# Patient Record
Sex: Female | Born: 1990 | Race: Black or African American | Hispanic: No | Marital: Single | Smoking: Never smoker
Health system: Southern US, Community
[De-identification: ages and names within clinical notes are randomized; demographics above are authoritative.]

## PROBLEM LIST (undated history)

## (undated) DIAGNOSIS — Z789 Other specified health status: Secondary | ICD-10-CM

## (undated) HISTORY — PX: HERNIA REPAIR: SHX51

---
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
## 2016-08-12 ENCOUNTER — Emergency Department (HOSPITAL_COMMUNITY)
Admission: EM | Admit: 2016-08-12 | Discharge: 2016-08-12 | Disposition: A | Discharge status: Home / Self Care | Payer: Medicaid Other | Attending: Emergency Medicine | Admitting: Emergency Medicine

## 2016-08-12 ENCOUNTER — Emergency Department (HOSPITAL_COMMUNITY): Payer: Medicaid Other

## 2016-08-12 ENCOUNTER — Encounter (HOSPITAL_COMMUNITY): Payer: Self-pay | Admitting: Emergency Medicine

## 2016-08-12 DIAGNOSIS — B9789 Other viral agents as the cause of diseases classified elsewhere: Secondary | ICD-10-CM

## 2016-08-12 DIAGNOSIS — R05 Cough: Secondary | ICD-10-CM | POA: Diagnosis present

## 2016-08-12 DIAGNOSIS — J069 Acute upper respiratory infection, unspecified: Secondary | ICD-10-CM

## 2016-08-12 MED ORDER — BENZONATATE 100 MG PO CAPS: 100.0000 mg | ORAL_CAPSULE | Freq: Once | ORAL | Status: DC

## 2016-08-12 NOTE — ED Provider Notes (Signed)
[facility]-EMERGENCY DEPT Provider Note   CSN: [id number] Arrival date & time: 08/12/16  1405   By signing my name below, I, Nelwyn SalisburyJoshua Fowler, attest that this documentation has been prepared under the direction and in the presence of non-physician practitioner, Rise MuKenneth T. Buford Gayler, PA-C.Marland Kitchen. Electronically Signed: Nelwyn SalisburyJoshua Fowler, Scribe. 08/12/2016. 4:21 PM.   History   Chief Complaint Chief Complaint  Patient presents with  . URI  . Cough   The history is provided by the patient. No language interpreter was used.    HPI Comments:  Alison Cruz is an otherwise healthy 25 y.o. female who presents to the Emergency Department complaining of gradual onset frequent moderate productive cough beginning about 5 days ago. Pt describes her sputum as thick but clear. She reports associated chest soreness secondary to cough, congestion, rhinorrhea, sneezing, fever, chills, diffuse muscle aches. She has taken Zycam at home for her symptoms with minimal relief. Pt denies any sore throat, nausea or SOB. She smokes Black and Mild's very rarely. Endorses sick contacts.  History reviewed. No pertinent past medical history.  There are no active problems to display for this patient.   History reviewed. No pertinent surgical history.  OB History    No data available       Home Medications    Prior to Admission medications   Not on File    Family History No family history on file.  Social History Social History  Substance Use Topics  . Smoking status: Never Smoker  . Smokeless tobacco: Never Used  . Alcohol use Yes     Allergies   Patient has no allergy information on record.   Review of Systems Review of Systems  Constitutional: Positive for chills and fever.  HENT: Positive for congestion, rhinorrhea and sneezing. Negative for sore throat.   Respiratory: Positive for cough, chest tightness and wheezing. Negative for shortness of breath.   Gastrointestinal: Negative for nausea.    Musculoskeletal: Positive for myalgias.     Physical Exam Updated Vital Signs BP 129/78 (BP Location: Right Arm)   Pulse 73   Temp 97.9 F (36.6 C) (Oral)   Resp 17   LMP 06/25/2016 Comment: irregular periods  SpO2 100%   Physical Exam  Constitutional: She is oriented to person, place, and time. She appears well-developed and well-nourished. No distress.  HENT:  Head: Normocephalic and atraumatic.  Right Ear: Tympanic membrane, external ear and ear canal normal.  Left Ear: Tympanic membrane, external ear and ear canal normal.  Nose: Mucosal edema and rhinorrhea present. Right sinus exhibits maxillary sinus tenderness. Right sinus exhibits no frontal sinus tenderness. Left sinus exhibits maxillary sinus tenderness. Left sinus exhibits no frontal sinus tenderness.  Mouth/Throat: Uvula is midline, oropharynx is clear and moist and mucous membranes are normal.  Eyes: Conjunctivae are normal.  Neck: Normal range of motion. Neck supple.  Cardiovascular: Normal rate.   Pulmonary/Chest: Effort normal and breath sounds normal. No respiratory distress.  CTAB  Lymphadenopathy:    She has no cervical adenopathy.  Neurological: She is alert and oriented to person, place, and time.  Skin: Skin is warm and dry. Capillary refill takes less than 2 seconds.  Psychiatric: She has a normal mood and affect.  Nursing note and vitals reviewed.    ED Treatments / Results  DIAGNOSTIC STUDIES:  Oxygen Saturation is 100% on RA, normal by my interpretation.    COORDINATION OF CARE:  4:26 PM Discussed treatment plan with pt at bedside which includes OTC Mucinex DM  and symptomatic management and pt agreed to plan.  Labs (all labs ordered are listed, but only abnormal results are displayed) Labs Reviewed - No data to display  EKG  EKG Interpretation None       Radiology Dg Chest 2 View  Result Date: 08/12/2016 CLINICAL DATA:  Cough with congestion and chest pain EXAM: CHEST  2 VIEW  COMPARISON:  March 17, 2016 FINDINGS: Lungs are clear. Heart size and pulmonary vascularity are normal. No adenopathy. No pneumothorax. No bone lesions. IMPRESSION: No edema or consolidation. Electronically Signed   By: Bretta BangWilliam  Woodruff III M.D.   On: 08/12/2016 17:22    Procedures Procedures (including critical care time)  Medications Ordered in ED Medications - No data to display   Initial Impression / Assessment and Plan / ED Course  I have reviewed the triage vital signs and the nursing notes.  Pertinent labs & imaging results that were available during my care of the patient were reviewed by me and considered in my medical decision making (see chart for details).  Clinical Course   Pt CXR negative for acute infiltrate. Patients symptoms are consistent with URI, likely viral etiology. Discussed that antibiotics are not indicated for viral infections. Pt will be discharged with symptomatic treatment.  Verbalizes understanding and is agreeable with plan.Patient is afebrile and not tachycardic. Pt is hemodynamically stable & in NAD prior to dc.Discussed return precautions.   Final Clinical Impressions(s) / ED Diagnoses   Final diagnoses:  Viral URI with cough    New Prescriptions New Prescriptions   No medications on file  I personally performed the services described in this documentation, which was scribed in my presence. The recorded information has been reviewed and is accurate.     Rise MuKenneth T Shemeka Wardle, PA-C 08/12/16 1740    Gwyneth SproutWhitney Plunkett, MD 08/12/16 2036

## 2016-08-12 NOTE — ED Notes (Signed)
Pt here with cold cough stuffy nose for 1-2 weeks  She has difficulty breathing through her nose  Temp last  Friday productive cough thick yelow

## 2016-08-12 NOTE — Discharge Instructions (Signed)
Your chest x-ray was normal today. This is likely a viral infection. I recommend getting Mucinex DM over-the-counter with nasal decongestant and cough suppressant. You may also use hot steam from shower to help loosen nasal secretions. Please stay hydrated with plenty of water. You may also use nasal rinses to help wash out the nose. Please follow-up with a primary care doctor. Return to the ED if your symptoms persist for the next 5-6 days or if they worsen. You may take Tylenol and ibuprofen for pain and fever.

## 2016-08-12 NOTE — ED Triage Notes (Signed)
Pt. Stated, Alison Cruz had a cold and cough for the past week. Congestion and cough. No OTC medication is working.

## 2016-08-12 NOTE — ED Notes (Signed)
Patient returned from xray.

## 2020-02-02 ENCOUNTER — Other Ambulatory Visit: Payer: Self-pay

## 2020-02-02 ENCOUNTER — Emergency Department (HOSPITAL_COMMUNITY)
Admission: EM | Admit: 2020-02-02 | Discharge: 2020-02-02 | Disposition: A | Discharge status: Home / Self Care | Payer: Self-pay | Attending: Emergency Medicine | Admitting: Emergency Medicine

## 2020-02-02 ENCOUNTER — Encounter (HOSPITAL_COMMUNITY): Payer: Self-pay | Admitting: *Deleted

## 2020-02-02 DIAGNOSIS — M791 Myalgia, unspecified site: Secondary | ICD-10-CM | POA: Insufficient documentation

## 2020-02-02 MED ORDER — IBUPROFEN 600 MG PO TABS: 600.0000 mg | ORAL_TABLET | Freq: Four times a day (QID) | ORAL | 0 refills | Status: DC | PRN

## 2020-02-02 MED ORDER — METAXALONE 800 MG PO TABS: 800.0000 mg | ORAL_TABLET | Freq: Three times a day (TID) | ORAL | 0 refills | Status: DC
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
## 2020-02-26 ENCOUNTER — Ambulatory Visit: Payer: Medicaid Other | Attending: Internal Medicine

## 2020-02-26 DIAGNOSIS — Z20822 Contact with and (suspected) exposure to covid-19: Secondary | ICD-10-CM

## 2020-02-27 LAB — SARS-COV-2, NAA 2 DAY TAT

## 2020-02-27 LAB — NOVEL CORONAVIRUS, NAA: SARS-CoV-2, NAA: NOT DETECTED

## 2020-04-04 LAB — OB RESULTS CONSOLE GC/CHLAMYDIA
Chlamydia: NEGATIVE
Gonorrhea: NEGATIVE

## 2020-04-04 LAB — OB RESULTS CONSOLE HEPATITIS B SURFACE ANTIGEN: Hepatitis B Surface Ag: NEGATIVE

## 2020-04-04 LAB — OB RESULTS CONSOLE ANTIBODY SCREEN: Antibody Screen: NEGATIVE

## 2020-04-04 LAB — OB RESULTS CONSOLE HIV ANTIBODY (ROUTINE TESTING): HIV: NONREACTIVE

## 2020-04-04 LAB — OB RESULTS CONSOLE RPR: RPR: NONREACTIVE

## 2020-04-04 LAB — OB RESULTS CONSOLE RUBELLA ANTIBODY, IGM: Rubella: UNDETERMINED

## 2020-04-04 LAB — OB RESULTS CONSOLE ABO/RH: RH Type: POSITIVE

## 2020-08-06 ENCOUNTER — Emergency Department (HOSPITAL_BASED_OUTPATIENT_CLINIC_OR_DEPARTMENT_OTHER)
Admission: EM | Admit: 2020-08-06 | Discharge: 2020-08-06 | Disposition: A | Discharge status: Home / Self Care | Payer: Medicaid Other | Attending: Emergency Medicine | Admitting: Emergency Medicine

## 2020-08-06 ENCOUNTER — Emergency Department (HOSPITAL_BASED_OUTPATIENT_CLINIC_OR_DEPARTMENT_OTHER): Payer: Medicaid Other

## 2020-08-06 ENCOUNTER — Encounter (HOSPITAL_BASED_OUTPATIENT_CLINIC_OR_DEPARTMENT_OTHER): Payer: Self-pay

## 2020-08-06 ENCOUNTER — Other Ambulatory Visit: Payer: Self-pay

## 2020-08-06 DIAGNOSIS — J069 Acute upper respiratory infection, unspecified: Secondary | ICD-10-CM

## 2020-08-06 DIAGNOSIS — U071 COVID-19: Secondary | ICD-10-CM | POA: Diagnosis not present

## 2020-08-06 DIAGNOSIS — R059 Cough, unspecified: Secondary | ICD-10-CM | POA: Diagnosis present

## 2020-08-06 LAB — CBC WITH DIFFERENTIAL/PLATELET
Abs Immature Granulocytes: 0.1 10*3/uL — ABNORMAL HIGH (ref 0.00–0.07)
Basophils Absolute: 0 10*3/uL (ref 0.0–0.1)
Basophils Relative: 0 %
Eosinophils Absolute: 0 10*3/uL (ref 0.0–0.5)
Eosinophils Relative: 0 %
HCT: 32.2 % — ABNORMAL LOW (ref 36.0–46.0)
Hemoglobin: 10.1 g/dL — ABNORMAL LOW (ref 12.0–15.0)
Immature Granulocytes: 1 %
Lymphocytes Relative: 5 %
Lymphs Abs: 0.4 10*3/uL — ABNORMAL LOW (ref 0.7–4.0)
MCH: 25.4 pg — ABNORMAL LOW (ref 26.0–34.0)
MCHC: 31.4 g/dL (ref 30.0–36.0)
MCV: 80.9 fL (ref 80.0–100.0)
Monocytes Absolute: 0.8 10*3/uL (ref 0.1–1.0)
Monocytes Relative: 11 %
Neutro Abs: 6.5 10*3/uL (ref 1.7–7.7)
Neutrophils Relative %: 83 %
Platelets: 245 10*3/uL (ref 150–400)
RBC: 3.98 MIL/uL (ref 3.87–5.11)
RDW: 15.5 % (ref 11.5–15.5)
WBC: 7.8 10*3/uL (ref 4.0–10.5)
nRBC: 0.3 % — ABNORMAL HIGH (ref 0.0–0.2)

## 2020-08-06 LAB — COMPREHENSIVE METABOLIC PANEL
ALT: 10 U/L (ref 0–44)
AST: 19 U/L (ref 15–41)
Albumin: 3.1 g/dL — ABNORMAL LOW (ref 3.5–5.0)
Alkaline Phosphatase: 66 U/L (ref 38–126)
Anion gap: 9 (ref 5–15)
BUN: 5 mg/dL — ABNORMAL LOW (ref 6–20)
CO2: 21 mmol/L — ABNORMAL LOW (ref 22–32)
Calcium: 8.9 mg/dL (ref 8.9–10.3)
Chloride: 101 mmol/L (ref 98–111)
Creatinine, Ser: 0.44 mg/dL (ref 0.44–1.00)
GFR, Estimated: >60 mL/min (ref >60)
Glucose, Bld: 93 mg/dL (ref 70–99)
Potassium: 3.2 mmol/L — ABNORMAL LOW (ref 3.5–5.1)
Sodium: 131 mmol/L — ABNORMAL LOW (ref 135–145)
Total Bilirubin: <0.1 mg/dL — ABNORMAL LOW (ref 0.3–1.2)
Total Protein: 7 g/dL (ref 6.5–8.1)

## 2020-08-06 LAB — LIPASE, BLOOD: Lipase: 20 U/L (ref 11–51)

## 2020-08-06 LAB — RESP PANEL BY RT-PCR (FLU A&B, COVID) ARPGX2
Influenza A by PCR: NEGATIVE
Influenza B by PCR: NEGATIVE
SARS Coronavirus 2 by RT PCR: POSITIVE — AB

## 2020-08-06 MED ORDER — SODIUM CHLORIDE 0.9 % IV BOLUS
500.0000 mL | Freq: Once | INTRAVENOUS | Status: AC
  Administered 2020-08-06: 13:00:00 500 mL via INTRAVENOUS

## 2020-08-06 MED ORDER — POTASSIUM CHLORIDE CRYS ER 20 MEQ PO TBCR
40.0000 meq | EXTENDED_RELEASE_TABLET | Freq: Once | ORAL | Status: AC
  Administered 2020-08-06: 14:00:00 40 meq via ORAL
  Filled 2020-08-06: qty 2

## 2020-08-06 MED ORDER — ACETAMINOPHEN 325 MG PO TABS
650.0000 mg | ORAL_TABLET | Freq: Once | ORAL | Status: AC
  Administered 2020-08-06: 13:00:00 650 mg via ORAL
  Filled 2020-08-06: qty 2

## 2020-08-06 NOTE — ED Notes (Signed)
ED Provider at bedside. 

## 2020-08-06 NOTE — Discharge Instructions (Signed)
At this time there does not appear to be the presence of an emergent medical condition, however there is always the potential for conditions to change. Please read and follow the below instructions.  Please return to the Emergency Department immediately for any new or worsening symptoms. Please be sure to follow up with your Primary Care Provider within one week regarding your visit today; please call their office to schedule an appointment even if you are feeling better for a follow-up visit. You have been referred for monoclonal antibody infusion.  The infusion center will call you within the next day to schedule you an appointment for tomorrow.  Please be sure to answer your phone as they will call from the unknown number.  Please drink plenty of water to avoid dehydration and get plenty of rest.  You may use over-the-counter Tylenol as directed on the packaging to help with your symptoms. Please go to the drugstore and buy a pulse oximeter to monitor your oxygen level.  If your oxygen level is ever below 90% please return immediately to the emergency department for evaluation.  Go to the nearest Emergency Department immediately if: You have fever or chills You have signs or symptoms of labor before 37 weeks of pregnancy. These include: Contractions that are 5 minutes or less apart, or that increase in frequency, intensity, or length. Sudden, sharp pain in the abdomen or in the lower back. A gush or trickle of fluid from your vagina. You have signs of more serious illness such as: You have difficulty breathing. You have chest pain. You have a fever greater than 102F (39C) or higher that does not go away. You cannot drink fluids without vomiting. You have belly pain You feel extremely weak or you faint. You have any new/concerning or worsening of symptoms.   Please read the additional information packets attached to your discharge summary.  Do not take your medicine if  develop an itchy  rash, swelling in your mouth or lips, or difficulty breathing; call 911 and seek immediate emergency medical attention if this occurs.  You may review your lab tests and imaging results in their entirety on your MyChart account.  Please discuss all results of fully with your primary care provider and other specialist at your follow-up visit.  Note: Portions of this text may have been transcribed using voice recognition software. Every effort was made to ensure accuracy; however, inadvertent computerized transcription errors may still be present.

## 2020-08-06 NOTE — ED Notes (Signed)
Portable phone [phone] provided to patient to call family member

## 2020-08-06 NOTE — ED Triage Notes (Addendum)
Pt c/o flu like sx started 1am-NAD-steady gait-pt states she is "7 months pregnant"

## 2020-08-06 NOTE — ED Provider Notes (Signed)
[facility] EMERGENCY DEPARTMENT Provider Note   CSN: [id number] Arrival date & time: 08/06/20  1141     History Chief Complaint  Patient presents with  . Cough    Alison Cruz is a 29 y.o. female 7 months pregnant otherwise healthy presents today for fever chills body aches and nonproductive cough onset 1 AM this morning.  No medications prior to arrival.  She is unvaccinated against COVID-19.  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  She reports she woke up with symptoms around 1 AM feeling chills and warm at the same time had not measured temperature prior to arrival.  She reports mild nonproductive cough without associated chest pain or shortness of breath. One episode of nbnb posttussive emesis.  She reports aching sensation to all of her muscles, nonradiating no clear aggravating or alleviating factors and moderate intensity.  Denies headache, sore throat, neck stiffness, chest pain, shortness of breath, hemoptysis, abdominal pain, nausea, diarrhea, extremity swelling/color change, pelvic pain, vaginal bleeding/fluid loss, pregnancy related concerns or any additional concerns  HPI     History reviewed. No pertinent past medical history.  There are no problems to display for this patient.   Past Surgical History:  Procedure Laterality Date  . HERNIA REPAIR       OB History    Gravida  1   Para      Term      Preterm      AB      Living        SAB      IAB      Ectopic      Multiple      Live Births              No family history on file.  Social History   Tobacco Use  . Smoking status: Never Smoker  . Smokeless tobacco: Never Used  Vaping Use  . Vaping Use: Never used  Substance Use Topics  . Alcohol use: Not Currently  . Drug use: No    Home Medications Prior to Admission medications   Medication Sig Start Date End Date Taking? Authorizing Provider  Butalbital-APAP-Caffeine 50-300-40 MG CAPS Take 1  capsule by mouth every 6 (six) hours as needed. 05/22/20   [provider]  ibuprofen (ADVIL) 600 MG tablet Take 1 tablet (600 mg total) by mouth every 6 (six) hours as needed. 02/02/20   Lorre Nick, MD  metaxalone (SKELAXIN) 800 MG tablet Take 1 tablet (800 mg total) by mouth 3 (three) times daily. 02/02/20   Lorre Nick, MD    Allergies    Patient has no known allergies.  Review of Systems   Review of Systems Ten systems are reviewed and are negative for acute change except as noted in the HPI  Physical Exam Updated Vital Signs BP 117/72 (BP Location: Right Arm)   Pulse (!) 113   Temp 99.1 F (37.3 C) (Oral)   Resp 16   Ht 5\' 2"  (1.575 m)   Wt 73.7 kg   SpO2 100%   BMI 29.70 kg/m   Physical Exam Constitutional:      General: She is not in acute distress.    Appearance: Normal appearance. She is well-developed. She is not ill-appearing or diaphoretic.  HENT:     Head: Normocephalic and atraumatic.     Right Ear: External ear normal.     Left Ear: External ear normal.  Mouth/Throat:     Mouth: Mucous membranes are moist.     Pharynx: Oropharynx is clear.  Eyes:     General: Vision grossly intact. Gaze aligned appropriately.     Pupils: Pupils are equal, round, and reactive to light.  Neck:     Trachea: Trachea and phonation normal.  Cardiovascular:     Rate and Rhythm: Normal rate and regular rhythm.     Pulses: Normal pulses.  Pulmonary:     Effort: Pulmonary effort is normal. No respiratory distress.  Abdominal:     General: There is no distension.     Palpations: Abdomen is soft.     Tenderness: There is no abdominal tenderness. There is no guarding or rebound.     Comments: Gravid abdomen  Musculoskeletal:        General: Normal range of motion.     Cervical back: Normal range of motion.     Right lower leg: No edema.     Left lower leg: No edema.  Skin:    General: Skin is warm and dry.  Neurological:     Mental Status: She is alert.      GCS: GCS eye subscore is 4. GCS verbal subscore is 5. GCS motor subscore is 6.     Comments: Speech is clear and goal oriented, follows commands Major Cranial nerves without deficit, no facial droop Moves extremities without ataxia, coordination intact  Psychiatric:        Behavior: Behavior normal.     ED Results / Procedures / Treatments   Labs (all labs ordered are listed, but only abnormal results are displayed) Labs Reviewed  RESP PANEL BY RT-PCR (FLU A&B, COVID) ARPGX2 - Abnormal; Notable for the following components:      Result Value   SARS Coronavirus 2 by RT PCR POSITIVE (*)    All other components within normal limits  CBC WITH DIFFERENTIAL/PLATELET - Abnormal; Notable for the following components:   Hemoglobin 10.1 (*)    HCT 32.2 (*)    MCH 25.4 (*)    nRBC 0.3 (*)    Lymphs Abs 0.4 (*)    Abs Immature Granulocytes 0.10 (*)    All other components within normal limits  COMPREHENSIVE METABOLIC PANEL - Abnormal; Notable for the following components:   Sodium 131 (*)    Potassium 3.2 (*)    CO2 21 (*)    BUN 5 (*)    Albumin 3.1 (*)    Total Bilirubin <0.1 (*)    All other components within normal limits  LIPASE, BLOOD    EKG None  Radiology DG Chest Portable 1 View  Result Date: 08/06/2020 CLINICAL DATA:  Cough EXAM: PORTABLE CHEST 1 VIEW COMPARISON:  08/12/2016 FINDINGS: The heart size and mediastinal contours are within normal limits. No focal airspace consolidation, pleural effusion, or pneumothorax. The visualized skeletal structures are unremarkable. IMPRESSION: No active disease. Electronically Signed   By: Duanne Guess D.O.   On: 08/06/2020 12:40    Procedures Procedures (including critical care time)  Medications Ordered in ED Medications  acetaminophen (TYLENOL) tablet 650 mg (650 mg Oral Given 08/06/20 1235)  sodium chloride 0.9 % bolus 500 mL (500 mLs Intravenous New Bag/Given 08/06/20 1257)  potassium chloride SA (KLOR-CON) CR tablet 40  mEq (40 mEq Oral Given 08/06/20 1404)    ED Course  I have reviewed the triage vital signs and the nursing notes.  Pertinent labs & imaging results that were available during my care of  the patient were reviewed by me and considered in my medical decision making (see chart for details).  Clinical Course as of 08/06/20 1517  Tue Aug 06, 2020  6158 30 year old 16-month pregnant unvaccinated female arrived with URI symptoms that began this morning at 1 AM. She is reporting body aches, cough, fatigue and small amount of posttussive emesis. She denies any chest pain shortness of breath abdominal pain or pregnancy related concerns. Will obtain screening labs, chest x-ray and Covid test. Patient will be febrile and tachycardic on arrival suspect this is from viral illness likely COVID-19 viral infection. Will give Tylenol and fluids. Discussed case with Dr. Jacqulyn Bath who agrees with plan of care, additionally will obtain fetal heart tones. [BM]  1317 Informed by RN that patient with low blood pressure after starting IV, recently started 500 mL fluid bolus. I reassessed the patient she is resting comfortably in bed no acute distress reports that she is feeling better after taking Tylenol. She is fully alert and oriented. I suspect aberrant blood pressure reading may be machine error versus vagal from starting IV. Will monitor patient and recheck. Also noted that patient is wearing the extra-large blood pressure cuff, nursing staff is changing to an appropriate sized cuff. [BM]  1320 DG Chest Portable 1 View IMPRESSION: No active disease. - I have personally reviewed patient's chest x-ray and agree with radiologist interpretation. [BM]  1343 Blood pressure cough changed.  Repeat blood pressure improved 114/70. [BM]  1343 CBC with Differential(!) Anemia 10.1 no priors to compare.  May have physiologic anemia from pregnancy.  No chest pain or exertional dyspnea to suggest symptomatic anemia.  No leukocytosis to  suggest infection.  No thrombocytopenia. [BM]  1352 Comprehensive metabolic panel(!) Hyponatremia 131, hypokalemia 3.2 suspect secondary to decreased p.o. intake and some posttussive emesis.  Will replete in the ER.  No AKI LFT elevations or gap. [BM]  1353 Lipase, blood Within normal limits doubt pancreatitis. [BM]  1404 Resp Panel by RT-PCR (Flu A&B, Covid) Nasopharyngeal Swab(!) Covid positive [BM]  1405 Patient updated on Covid results.  She states understanding.  She is asking for monoclonal antibody infusion.  Will consult patient's OB/GYN at [facility] prior to administration. [BM]  1410 [facility] Pharmacist advises no dose changes for MAB in pregnancy [BM]  1416 Dr. Timothy Lasso, [facility] agrees with MAB infusion. [BM]  1428 Unable to give monoclonal antibody infusion at this facility due to lack of fetal monitoring?  Discussed case again with Dr. Timothy Lasso who is reaching out to the [facility]U to see if they would be willing to take patient and monitor her while she receives monoclonal antibody infusions. [BM]  1445 Dr. Nyra Jabs spoke with [facility]U, they are not able to have patient go there in transfer for infusion.  The best option at this point is outpatient infusion center. [BM]  1455 I have reached out to Mab infusion secure chat; Brion Aliment NP advises they will get patient in tomorrow for monoclonal antibody infusion. [BM]    Clinical Course User Index [BM] Elizabeth Palau   MDM Rules/Calculators/A&P                         Additional history obtained from: 1. Nursing notes from this visit. 2. Review of electronic medical records.  Patient reassessed at discharge resting comfortably no acute distress talking on the phone with her mother vital signs stable on room air.  Nontoxic-appearing.  Patient mildly tachycardic heart rate at  100 bpm regular rhythm suspect due to COVID.  Patient's work-up and history is consistent with COVID-19 viral infection.  She is aware to answer her phone  and go to monoclonal antibody infusion tomorrow for treatment.  She will call her PCP and OB/GYN office to schedule follow-up appointment.  Patient advised to obtain home pulse oximeter to monitor O2 saturations.  Advised to maintain water hydration and get plenty of rest.  Tylenol as directed on the packaging to help with symptoms.  There is no evidence of bacterial infection requiring antibiotics, no chest pain or shortness of breath to suggest pulmonary embolism, ACS, dissection or other emergent cardiopulmonary pathologies at this time.  Additionally patient without pregnancy related symptoms no indication for further imaging or treatment.  At this time there does not appear to be any evidence of an acute emergency medical condition and the patient appears stable for discharge with appropriate outpatient follow up. Diagnosis was discussed with patient who verbalizes understanding of care plan and is agreeable to discharge. I have discussed return precautions with patient who verbalizes understanding. Patient encouraged to follow-up with their PCP and OBGYN. All questions answered.  Patient's case discussed with Dr. Jacqulyn BathLong who agrees with plan to discharge with follow-up.   Note: Portions of this report may have been transcribed using voice recognition software. Every effort was made to ensure accuracy; however, inadvertent computerized transcription errors may still be present. Final Clinical Impression(s) / ED Diagnoses Final diagnoses:  Viral URI with cough  COVID-19 virus infection    Rx / DC Orders ED Discharge Orders    None       Elizabeth PalauMorelli, Delora Gravatt A, PA-C 08/06/20 1517    Maia PlanLong, Joshua G, MD 08/12/20 [phone]

## 2020-08-06 NOTE — ED Notes (Signed)
Pt ambulatory with steady gait, no s/s if distress.

## 2020-08-07 ENCOUNTER — Encounter: Payer: Self-pay | Admitting: Unknown Physician Specialty

## 2020-08-07 ENCOUNTER — Ambulatory Visit (HOSPITAL_COMMUNITY)
Admission: RE | Admit: 2020-08-07 | Discharge: 2020-08-07 | Disposition: A | Discharge status: Home / Self Care | Payer: Medicaid Other | Source: Ambulatory Visit | Attending: Pulmonary Disease | Admitting: Pulmonary Disease

## 2020-08-07 ENCOUNTER — Telehealth (HOSPITAL_COMMUNITY): Payer: Self-pay | Admitting: Emergency Medicine

## 2020-08-07 ENCOUNTER — Telehealth: Payer: Self-pay | Admitting: Unknown Physician Specialty

## 2020-08-07 ENCOUNTER — Other Ambulatory Visit: Payer: Self-pay | Admitting: Unknown Physician Specialty

## 2020-08-07 DIAGNOSIS — U071 COVID-19: Secondary | ICD-10-CM

## 2020-08-07 DIAGNOSIS — Z3490 Encounter for supervision of normal pregnancy, unspecified, unspecified trimester: Secondary | ICD-10-CM

## 2020-08-07 MED ORDER — SODIUM CHLORIDE 0.9 % IV SOLN: INTRAVENOUS | Status: DC | PRN

## 2020-08-07 MED ORDER — DIPHENHYDRAMINE HCL 50 MG/ML IJ SOLN: 50.0000 mg | Freq: Once | INTRAMUSCULAR | Status: DC | PRN

## 2020-08-07 MED ORDER — ALBUTEROL SULFATE HFA 108 (90 BASE) MCG/ACT IN AERS: 2.0000 | INHALATION_SPRAY | Freq: Once | RESPIRATORY_TRACT | Status: DC | PRN

## 2020-08-07 MED ORDER — METHYLPREDNISOLONE SODIUM SUCC 125 MG IJ SOLR: 125.0000 mg | Freq: Once | INTRAMUSCULAR | Status: DC | PRN

## 2020-08-07 MED ORDER — SODIUM CHLORIDE 0.9 % IV SOLN: Freq: Once | INTRAVENOUS | Status: AC

## 2020-08-07 MED ORDER — SODIUM CHLORIDE 0.9 % IV SOLN: 1200.0000 mg | Freq: Once | INTRAVENOUS | Status: DC

## 2020-08-07 MED ORDER — FAMOTIDINE IN NACL 20-0.9 MG/50ML-% IV SOLN: 20.0000 mg | Freq: Once | INTRAVENOUS | Status: DC | PRN

## 2020-08-07 MED ORDER — EPINEPHRINE 0.3 MG/0.3ML IJ SOAJ: 0.3000 mg | Freq: Once | INTRAMUSCULAR | Status: DC | PRN

## 2020-08-07 NOTE — Progress Notes (Signed)
Patient reviewed Fact Sheet for Patients, Parents, and Caregivers for Emergency Use Authorization (EUA) of bamlanivimab and etesevimab for the Treatment of Coronavirus. Patient also reviewed and is agreeable to the estimated cost of treatment. Patient is agreeable to proceed.   

## 2020-08-07 NOTE — Telephone Encounter (Signed)
I connected by phone with Alison Cruz on 08/07/2020 at 3:53 PM to discuss the potential use of a new treatment for mild to moderate COVID-19 viral infection in non-hospitalized patients.  This patient is a 29 y.o. female that meets the FDA criteria for Emergency Use Authorization of COVID monoclonal antibody casirivimab/imdevimab, bamlanivimab/etesevimab, or sotrovimab.  Has a (+) direct SARS-CoV-2 viral test result  Has mild or moderate COVID-19   Is NOT hospitalized due to COVID-19  Is within 10 days of symptom onset  Has at least one of the high risk factor(s) for progression to severe COVID-19 and/or hospitalization as defined in EUA.  Specific high risk criteria : BMI > 25 and Pregnancy referred by OB   I have spoken and communicated the following to the patient or parent/caregiver regarding COVID monoclonal antibody treatment:  1. FDA has authorized the emergency use for the treatment of mild to moderate COVID-19 in adults and pediatric patients with positive results of direct SARS-CoV-2 viral testing who are 6 years of age and older weighing at least 40 kg, and who are at high risk for progressing to severe COVID-19 and/or hospitalization.  2. The significant known and potential risks and benefits of COVID monoclonal antibody, and the extent to which such potential risks and benefits are unknown.  3. Information on available alternative treatments and the risks and benefits of those alternatives, including clinical trials.  4. Patients treated with COVID monoclonal antibody should continue to self-isolate and use infection control measures (e.g., wear mask, isolate, social distance, avoid sharing personal items, clean and disinfect high touch surfaces, and frequent handwashing) according to CDC guidelines.   5. The patient or parent/caregiver has the option to accept or refuse COVID monoclonal antibody treatment.  After reviewing this information with the patient, the  patient has agreed to receive one of the available covid 19 monoclonal antibodies and will be provided an appropriate fact sheet prior to infusion. Gabriel Cirri, NP 08/07/2020 3:53 PM  Sx onset `12/20

## 2020-08-07 NOTE — Discharge Instructions (Signed)
10 Things You Can Do to Manage Your COVID-19 Symptoms at Home If you have possible or confirmed COVID-19: 1. Stay home from work and school. And stay away from other public places. If you must go out, avoid using any kind of public transportation, ridesharing, or taxis. 2. Monitor your symptoms carefully. If your symptoms get worse, call your healthcare provider immediately. 3. Get rest and stay hydrated. 4. If you have a medical appointment, call the healthcare provider ahead of time and tell them that you have or may have COVID-19. 5. For medical emergencies, call 911 and notify the dispatch personnel that you have or may have COVID-19. 6. Cover your cough and sneezes with a tissue or use the inside of your elbow. 7. Wash your hands often with soap and water for at least 20 seconds or clean your hands with an alcohol-based hand sanitizer that contains at least 60% alcohol. 8. As much as possible, stay in a specific room and away from other people in your home. Also, you should use a separate bathroom, if available. If you need to be around other people in or outside of the home, wear a mask. 9. Avoid sharing personal items with other people in your household, like dishes, towels, and bedding. 10. Clean all surfaces that are touched often, like counters, tabletops, and doorknobs. Use household cleaning sprays or wipes according to the label instructions. cdc.gov/coronavirus 02/15/2019 This information is not intended to replace advice given to you by your health care provider. Make sure you discuss any questions you have with your health care provider. Document Revised: 07/20/2019 Document Reviewed: 07/20/2019 Elsevier Patient Education  2020 Elsevier Inc. What types of side effects do monoclonal antibody drugs cause?  Common side effects  In general, the more common side effects caused by monoclonal antibody drugs include: . Allergic reactions, such as hives or itching . Flu-like signs and  symptoms, including chills, fatigue, fever, and muscle aches and pains . Nausea, vomiting . Diarrhea . Skin rashes . Low blood pressure   The CDC is recommending patients who receive monoclonal antibody treatments wait at least 90 days before being vaccinated.  Currently, there are no data on the safety and efficacy of mRNA COVID-19 vaccines in persons who received monoclonal antibodies or convalescent plasma as part of COVID-19 treatment. Based on the estimated half-life of such therapies as well as evidence suggesting that reinfection is uncommon in the 90 days after initial infection, vaccination should be deferred for at least 90 days, as a precautionary measure until additional information becomes available, to avoid interference of the antibody treatment with vaccine-induced immune responses. If you have any questions or concerns after the infusion please call the Advanced Practice Provider on call at [phone]. This number is ONLY intended for your use regarding questions or concerns about the infusion post-treatment side-effects.  Please do not provide this number to others for use. For return to work notes please contact your primary care provider.   If someone you know is interested in receiving treatment please have them call the COVID hotline at [phone].   

## 2020-08-07 NOTE — Progress Notes (Signed)
  Diagnosis: COVID-19 ° °Physician: Dr. Wright  ° °Procedure: Covid Infusion Clinic Med: bamlanivimab\etesevimab infusion - Provided patient with bamlanimivab\etesevimab fact sheet for patients, parents and caregivers prior to infusion. ° °Complications: No immediate complications noted. ° °Discharge: Discharged home  ° °Nicasio Barlowe  B Leisel Pinette °08/07/2020 ° ° °

## 2020-08-07 NOTE — Telephone Encounter (Signed)
Called pt and explained possible monoclonal antibody treatment. Pt is not vaccinated. Lives in [place]. Sx started 12/21. Tested positive 12/21 at [facility]. Sx include chills, nonproductive cough, and body aches. Qualifying risk factors include BMI 30.2. Pt is 7 months pregnant. Pt interested in tx. Informed pt an APP will call back to possibly schedule an appointment. NPs calling form unidentified numbers.

## 2020-08-22 ENCOUNTER — Other Ambulatory Visit: Payer: Self-pay | Admitting: Obstetrics and Gynecology

## 2020-08-22 DIAGNOSIS — Z363 Encounter for antenatal screening for malformations: Secondary | ICD-10-CM

## 2020-09-05 ENCOUNTER — Other Ambulatory Visit: Payer: Self-pay

## 2020-09-05 ENCOUNTER — Encounter (HOSPITAL_COMMUNITY): Payer: Self-pay | Admitting: Obstetrics and Gynecology

## 2020-09-05 ENCOUNTER — Inpatient Hospital Stay (HOSPITAL_COMMUNITY)
Admission: AD | Admit: 2020-09-05 | Discharge: 2020-09-05 | Disposition: A | Discharge status: Home / Self Care | Payer: Medicaid Other | Attending: Obstetrics and Gynecology | Admitting: Obstetrics and Gynecology

## 2020-09-05 DIAGNOSIS — R1084 Generalized abdominal pain: Secondary | ICD-10-CM | POA: Diagnosis present

## 2020-09-05 DIAGNOSIS — O4703 False labor before 37 completed weeks of gestation, third trimester: Secondary | ICD-10-CM | POA: Diagnosis not present

## 2020-09-05 DIAGNOSIS — Z3A31 31 weeks gestation of pregnancy: Secondary | ICD-10-CM | POA: Diagnosis not present

## 2020-09-05 HISTORY — DX: Other specified health status: Z78.9

## 2020-09-05 LAB — URINALYSIS, ROUTINE W REFLEX MICROSCOPIC
Bilirubin Urine: NEGATIVE
Glucose, UA: NEGATIVE mg/dL
Hgb urine dipstick: NEGATIVE
Ketones, ur: 20 mg/dL — AB
Leukocytes,Ua: NEGATIVE
Nitrite: NEGATIVE
Protein, ur: 30 mg/dL — AB
Specific Gravity, Urine: 1.02 (ref 1.005–1.030)
pH: 7 (ref 5.0–8.0)

## 2020-09-05 LAB — FETAL FIBRONECTIN: Fetal Fibronectin: NEGATIVE

## 2020-09-05 MED ORDER — NIFEDIPINE 10 MG PO CAPS
10.0000 mg | ORAL_CAPSULE | ORAL | Status: DC | PRN
  Administered 2020-09-05: 10 mg via ORAL
  Filled 2020-09-05: qty 1

## 2020-09-05 NOTE — MAU Provider Note (Signed)
Chief Complaint:  Contractions   Event Date/Time   First Provider Initiated Contact with Patient 09/05/20 0127     HPI: Alison Cruz is a 30 y.o. G2P1000 at 72w0dwho presents to maternity admissions reporting preterm contractions which woke her up. Does not describe them as painful.  States they are just tightening across upper abdomen.. She reports good fetal movement, denies LOF, vaginal bleeding, vaginal itching/burning, urinary symptoms, h/a, dizziness, n/v, diarrhea, constipation or fever/chills.    Abdominal Pain This is a new problem. The current episode started today. The problem occurs intermittently. The problem has been unchanged. The pain is located in the generalized abdominal region. The patient is experiencing no pain. The quality of the pain is dull. The abdominal pain does not radiate. Pertinent negatives include no constipation, diarrhea, dysuria, fever, myalgias, nausea or vomiting. Nothing aggravates the pain. The pain is relieved by nothing. She has tried nothing for the symptoms.    RN Note: Ctxs started tonight and woke me up. Denies LOF or VB.  Past Medical History: No past medical history on file.  Past obstetric history: OB History  Gravida Para Term Preterm AB Living  2 1 1         SAB IAB Ectopic Multiple Live Births               # Outcome Date GA Lbr Len/2nd Weight Sex Delivery Anes PTL Lv  2 Current           1 Term 03/25/17     CS-[id number]       Past Surgical History: Past Surgical History:  Procedure Laterality Date  . HERNIA REPAIR      Family History: No family history on file.  Social History: Social History   Tobacco Use  . Smoking status: Never Smoker  . Smokeless tobacco: Never Used  Vaping Use  . Vaping Use: Never used  Substance Use Topics  . Alcohol use: Not Currently  . Drug use: No    Allergies: No Known Allergies  Meds:  Medications Prior to Admission  Medication Sig Dispense Refill Last Dose  .  Butalbital-APAP-Caffeine 50-300-40 MG CAPS Take 1 capsule by mouth every 6 (six) hours as needed.     05/25/17 ibuprofen (ADVIL) 600 MG tablet Take 1 tablet (600 mg total) by mouth every 6 (six) hours as needed. 30 tablet 0   . metaxalone (SKELAXIN) 800 MG tablet Take 1 tablet (800 mg total) by mouth 3 (three) times daily. 21 tablet 0     I have reviewed patient's Past Medical Hx, Surgical Hx, Family Hx, Social Hx, medications and allergies.   ROS:  Review of Systems  Constitutional: Negative for fever.  Gastrointestinal: Positive for abdominal pain. Negative for constipation, diarrhea, nausea and vomiting.  Genitourinary: Negative for dysuria.  Musculoskeletal: Negative for myalgias.   Other systems negative  Physical Exam   Patient Vitals for the past 24 hrs:  BP Temp Pulse Resp Height Weight  09/05/20 0053 130/70 - 100 - - -  09/05/20 0051 - 98.9 F (37.2 C) - 18 5\' 1"  (1.549 m) 73.5 kg   Constitutional: Well-developed, well-nourished female in no acute distress.  Cardiovascular: normal rate and rhythm Respiratory: normal effort, clear to auscultation bilaterally GI: Abd soft, non-tender, gravid appropriate for gestational age.   No rebound or guarding. MS: Extremities nontender, no edema, normal ROM Neurologic: Alert and oriented x 4.  GU: Neg CVAT.  PELVIC EXAM: Fetal fibronectin collected Dilation: Closed Effacement (%): Thick Cervical  Position: Posterior Exam by:: Wynelle Bourgeois, CNM   FHT:  Baseline 140 , moderate variability, accelerations present, no decelerations Contractions: q 2-3 mins Irregular     Labs: Results for orders placed or performed during the hospital encounter of 09/05/20 (from the past 24 hour(s))  Fetal fibronectin     Status: None   Collection Time: 09/05/20  1:28 AM  Result Value Ref Range   Fetal Fibronectin NEGATIVE NEGATIVE  Urinalysis, Routine w reflex microscopic     Status: Abnormal   Collection Time: 09/05/20  2:03 AM  Result Value Ref  Range   Color, Urine YELLOW YELLOW   APPearance CLOUDY (A) CLEAR   Specific Gravity, Urine 1.020 1.005 - 1.030   pH 7.0 5.0 - 8.0   Glucose, UA NEGATIVE NEGATIVE mg/dL   Hgb urine dipstick NEGATIVE NEGATIVE   Bilirubin Urine NEGATIVE NEGATIVE   Ketones, ur 20 (A) NEGATIVE mg/dL   Protein, ur 30 (A) NEGATIVE mg/dL   Nitrite NEGATIVE NEGATIVE   Leukocytes,Ua NEGATIVE NEGATIVE   RBC / HPF 0-5 0 - 5 RBC/hpf   WBC, UA 6-10 0 - 5 WBC/hpf   Bacteria, UA FEW (A) NONE SEEN   Squamous Epithelial / LPF 21-50 0 - 5   Mucus PRESENT     Imaging:    MAU Course/MDM: I have ordered labs and reviewed results. Fetal fibronectin is NEGATIVE NST reviewed, reactive  Treatments in MAU included Procardia x 1 dose which stopped contractions.  Pt informed of negative FFn and signs of PTL to return for  Assessment: Single IUP at [redacted]w[redacted]d Preterm uterine contractions with no cervical change, resolved Negative Fetal fibronectin  Plan: Discharge home Preterm Labor precautions and fetal kick counts Follow up in Office for prenatal visits   Encouraged to return if she develops worsening of symptoms, increase in pain, fever, or other concerning symptoms.   Pt stable at time of discharge.  Wynelle Bourgeois CNM, MSN Certified Nurse-Midwife 09/05/2020 1:27 AM

## 2020-09-05 NOTE — Discharge Instructions (Signed)
Preterm Labor Pregnancy normally lasts 39-41 weeks. Preterm labor is when labor starts before you have been pregnant for 37 weeks. Babies who are born too early may have problems with blood sugar, body temperature, heart, and breathing. These problems may be very serious in babies who are born before 34 weeks of pregnancy. What are the causes? The cause of this condition is not known. What increases the risk? You are more likely to have preterm labor if:  You have medical problems, now or in the past.  You have problems now or in your past pregnancies.  You have lifestyle problems. Medical history  You have problems of the womb (uterus).  You have an infection, including infections you get from sex.  You have problems that do not go away, such as: ? Blood clots. ? High blood pressure. ? High blood sugar.  You have low body weight or too much body weight. Present and past pregnancies  You have had preterm labor before.  You are pregnant with two babies or more.  You have a condition in which the placenta covers your cervix.  You waited less than 6 months between giving birth and becoming pregnant again.  Your unborn baby has some problems.  You have bleeding from your vagina.  You became pregnant by a method called IVF. Lifestyle  You smoke.  You drink alcohol.  You use drugs.  You have stress.  You have abuse in your home.  You come in contact with chemicals that harm the body (pollutants). Other factors  You are younger than 17 years or older than 35 years. What are the signs or symptoms? Symptoms of this condition include:  Cramps. The cramps may feel like cramps from a period.  You may have watery poop (diarrhea).  Pain in the belly (abdomen).  Pain in the lower back.  Regular contractions. It may feel like your belly is getting tighter.  Pressure in the lower belly.  More fluid leaking from the vagina. The fluid may be watery or  bloody.  Water breaking. How is this treated? Treatment for this condition depends on your health, the health of your baby, and how old your pregnancy is. It may include:  Taking medicines, such as: ? Hormone medicines. ? Medicines to stop contractions. ? Medicines to help mature the baby's lungs. ? Medicines to prevent your baby from getting cerebral palsy.  Bed rest. If the labor happens before 34 weeks of pregnancy, you may need to stay in the hospital.  Delivering the baby. Follow these instructions at home:  Do not use any products that contain nicotine or tobacco, such as cigarettes, e-cigarettes, and chewing tobacco. If you need help quitting, ask your doctor.  Do not drink alcohol.  Take over-the-counter and prescription medicines only as told by your doctor.  Rest as told by your doctor.  Return to your activities as told by your doctor. Ask your doctor what activities are safe for you.  Keep all follow-up visits as told by your doctor. This is important.   How is this prevented? To have a healthy pregnancy:  Do not use street drugs.  Do not use any medicines unless you ask your doctor if they are safe for you.  Talk with your doctor before taking any herbal supplements.  Make sure you gain enough weight.  Watch for infection. If you think you might have an infection, get it checked right away. Symptoms of infection may include: ? Fever. ? Vaginal discharge. ?   Pain or burning when you pee. ? Needing to pee urgently. ? Needing to pee often. ? Peeing small amounts often. ? Blood in your pee. ? Pee that smells bad or unusual.  Tell your doctor if you have gone into preterm labor before. Contact a doctor if:  You think you are going into preterm labor.  You have symptoms of preterm labor.  You have symptoms of infection. Get help right away if:  You are having painful contractions every 5 minutes or less.  Your water breaks. Summary  Preterm labor  is labor that starts before you reach 37 weeks of pregnancy.  Your baby may have problems if delivered early.  The cause of preterm labor is not known. Having problems of the womb (uterus), an infection, or bleeding during pregnancy increases the risk.  Contact a doctor if you have signs or symptoms of preterm labor. This information is not intended to replace advice given to you by your health care provider. Make sure you discuss any questions you have with your health care provider. Document Revised: 09/05/2019 Document Reviewed: 09/05/2019 Elsevier Patient Education  2021 [employer] Inc.  

## 2020-09-05 NOTE — MAU Note (Signed)
Pt presents to MAU c/o tightening across her upper abdomen, poss contractions since 23:30 pm.  Pt denies LOF, vaginal bleeding.  Endorses +FM.  Plan of care for delivery includes a repeat cesarean per pt.

## 2020-09-05 NOTE — MAU Note (Signed)
Ctxs started tonight and woke me up. Denies LOF or VB.

## 2020-09-18 ENCOUNTER — Ambulatory Visit: Payer: Medicaid Other | Attending: Obstetrics and Gynecology

## 2020-09-18 ENCOUNTER — Ambulatory Visit: Payer: Medicaid Other | Admitting: *Deleted

## 2020-09-18 ENCOUNTER — Encounter: Payer: Self-pay | Admitting: *Deleted

## 2020-09-18 ENCOUNTER — Other Ambulatory Visit: Payer: Self-pay

## 2020-09-18 VITALS — BP 125/65 | HR 101

## 2020-09-18 DIAGNOSIS — O98513 Other viral diseases complicating pregnancy, third trimester: Secondary | ICD-10-CM

## 2020-09-18 DIAGNOSIS — U071 COVID-19: Secondary | ICD-10-CM

## 2020-09-18 DIAGNOSIS — Z363 Encounter for antenatal screening for malformations: Secondary | ICD-10-CM | POA: Diagnosis not present

## 2020-10-22 ENCOUNTER — Encounter (HOSPITAL_COMMUNITY): Payer: Self-pay

## 2020-10-22 NOTE — Patient Instructions (Signed)
Alison Cruz  10/22/2020   Your procedure is scheduled on:  10/31/2020  Arrive at 1015 at [employer] [facility] on [address] at [facility]  and [employer]. You are invited to use the FREE valet parking or use the Visitor's parking deck.  Pick up the phone at the desk and dial [phone].  Call this number if you have problems the morning of surgery: [phone]  Remember:   Do not eat food:(After Midnight) Desps de medianoche.  Do not drink clear liquids: (After Midnight) Desps de medianoche.  Take these medicines the morning of surgery with A SIP OF WATER:  none   Do not wear jewelry, make-up or nail polish.  Do not wear lotions, powders, or perfumes. Do not wear deodorant.  Do not shave 48 hours prior to surgery.  Do not bring valuables to the hospital.  [facility] is not   responsible for any belongings or valuables brought to the hospital.  Contacts, dentures or bridgework may not be worn into surgery.  Leave suitcase in the car. After surgery it may be brought to your room.  For patients admitted to the hospital, checkout time is 11:00 AM the day of              discharge.      Please read over the following fact sheets that you were given:     Preparing for Surgery

## 2020-10-29 ENCOUNTER — Encounter (HOSPITAL_COMMUNITY)
Admission: RE | Admit: 2020-10-29 | Discharge: 2020-10-29 | Disposition: A | Discharge status: Home / Self Care | Payer: Medicaid Other | Source: Ambulatory Visit | Attending: Obstetrics | Admitting: Obstetrics

## 2020-10-29 ENCOUNTER — Other Ambulatory Visit: Payer: Self-pay

## 2020-10-29 DIAGNOSIS — Z01812 Encounter for preprocedural laboratory examination: Secondary | ICD-10-CM | POA: Insufficient documentation

## 2020-10-29 LAB — CBC
HCT: 30.4 % — ABNORMAL LOW (ref 36.0–46.0)
Hemoglobin: 9.2 g/dL — ABNORMAL LOW (ref 12.0–15.0)
MCH: 22.7 pg — ABNORMAL LOW (ref 26.0–34.0)
MCHC: 30.3 g/dL (ref 30.0–36.0)
MCV: 74.9 fL — ABNORMAL LOW (ref 80.0–100.0)
Platelets: 262 10*3/uL (ref 150–400)
RBC: 4.06 MIL/uL (ref 3.87–5.11)
RDW: 17.8 % — ABNORMAL HIGH (ref 11.5–15.5)
WBC: 7.1 10*3/uL (ref 4.0–10.5)
nRBC: 0.4 % — ABNORMAL HIGH (ref 0.0–0.2)

## 2020-10-29 LAB — TYPE AND SCREEN
ABO/RH(D): A POS
Antibody Screen: NEGATIVE

## 2020-10-30 ENCOUNTER — Other Ambulatory Visit (HOSPITAL_COMMUNITY): Payer: Medicaid Other

## 2020-10-30 ENCOUNTER — Encounter (HOSPITAL_COMMUNITY): Admission: RE | Admit: 2020-10-30 | Payer: Medicaid Other | Source: Ambulatory Visit

## 2020-10-30 LAB — RPR: RPR Ser Ql: NONREACTIVE

## 2020-10-31 ENCOUNTER — Inpatient Hospital Stay (HOSPITAL_COMMUNITY): Payer: Medicaid Other | Admitting: Anesthesiology

## 2020-10-31 ENCOUNTER — Inpatient Hospital Stay (HOSPITAL_COMMUNITY)
Admission: RE | Admit: 2020-10-31 | Discharge: 2020-11-02 | DRG: 787 | Disposition: A | Discharge status: Home / Self Care | Payer: Medicaid Other | Attending: Obstetrics | Admitting: Obstetrics

## 2020-10-31 ENCOUNTER — Encounter (HOSPITAL_COMMUNITY): Payer: Self-pay | Admitting: Obstetrics

## 2020-10-31 ENCOUNTER — Encounter (HOSPITAL_COMMUNITY)
Admission: RE | Disposition: A | Discharge status: Home / Self Care | Payer: Self-pay | Source: Home / Self Care | Attending: Obstetrics

## 2020-10-31 ENCOUNTER — Other Ambulatory Visit: Payer: Self-pay

## 2020-10-31 DIAGNOSIS — Z3A39 39 weeks gestation of pregnancy: Secondary | ICD-10-CM | POA: Diagnosis not present

## 2020-10-31 DIAGNOSIS — O34211 Maternal care for low transverse scar from previous cesarean delivery: Principal | ICD-10-CM | POA: Diagnosis present

## 2020-10-31 DIAGNOSIS — D62 Acute posthemorrhagic anemia: Secondary | ICD-10-CM | POA: Diagnosis not present

## 2020-10-31 DIAGNOSIS — O9081 Anemia of the puerperium: Secondary | ICD-10-CM | POA: Diagnosis not present

## 2020-10-31 DIAGNOSIS — Z98891 History of uterine scar from previous surgery: Secondary | ICD-10-CM

## 2020-10-31 SURGERY — Surgical Case
Anesthesia: Spinal

## 2020-10-31 MED ORDER — NALBUPHINE HCL 10 MG/ML IJ SOLN: 5.0000 mg | INTRAMUSCULAR | Status: DC | PRN

## 2020-10-31 MED ORDER — DEXAMETHASONE SODIUM PHOSPHATE 10 MG/ML IJ SOLN
INTRAMUSCULAR | Status: DC | PRN
  Administered 2020-10-31: 10 mg via INTRAVENOUS

## 2020-10-31 MED ORDER — LACTATED RINGERS IV SOLN: INTRAVENOUS | Status: DC

## 2020-10-31 MED ORDER — FENTANYL CITRATE (PF) 100 MCG/2ML IJ SOLN: 25.0000 ug | INTRAMUSCULAR | Status: DC | PRN

## 2020-10-31 MED ORDER — ONDANSETRON HCL 4 MG/2ML IJ SOLN: 4.0000 mg | Freq: Three times a day (TID) | INTRAMUSCULAR | Status: DC | PRN

## 2020-10-31 MED ORDER — MENTHOL 3 MG MT LOZG: 1.0000 | LOZENGE | OROMUCOSAL | Status: DC | PRN

## 2020-10-31 MED ORDER — SIMETHICONE 80 MG PO CHEW
80.0000 mg | CHEWABLE_TABLET | Freq: Three times a day (TID) | ORAL | Status: DC
  Administered 2020-10-31 – 2020-11-02 (×4): 80 mg via ORAL
  Filled 2020-10-31 (×5): qty 1

## 2020-10-31 MED ORDER — MORPHINE SULFATE (PF) 0.5 MG/ML IJ SOLN
INTRAMUSCULAR | Status: DC | PRN
  Administered 2020-10-31: 150 ug via INTRATHECAL

## 2020-10-31 MED ORDER — FENTANYL CITRATE (PF) 100 MCG/2ML IJ SOLN
INTRAMUSCULAR | Status: AC
  Filled 2020-10-31: qty 2

## 2020-10-31 MED ORDER — STERILE WATER FOR IRRIGATION IR SOLN
Status: DC | PRN
  Administered 2020-10-31: 1000 mL

## 2020-10-31 MED ORDER — SODIUM CHLORIDE 0.9 % IR SOLN
Status: DC | PRN
  Administered 2020-10-31: 1

## 2020-10-31 MED ORDER — PHENYLEPHRINE HCL-NACL 20-0.9 MG/250ML-% IV SOLN
INTRAVENOUS | Status: AC
  Filled 2020-10-31: qty 250

## 2020-10-31 MED ORDER — WITCH HAZEL-GLYCERIN EX PADS: 1.0000 "application " | MEDICATED_PAD | CUTANEOUS | Status: DC | PRN

## 2020-10-31 MED ORDER — DIPHENHYDRAMINE HCL 25 MG PO CAPS: 25.0000 mg | ORAL_CAPSULE | ORAL | Status: DC | PRN

## 2020-10-31 MED ORDER — OXYTOCIN-SODIUM CHLORIDE 30-0.9 UT/500ML-% IV SOLN
INTRAVENOUS | Status: DC | PRN
  Administered 2020-10-31: 30 mL via INTRAVENOUS

## 2020-10-31 MED ORDER — FENTANYL CITRATE (PF) 100 MCG/2ML IJ SOLN
INTRAMUSCULAR | Status: DC | PRN
  Administered 2020-10-31: 15 ug via INTRATHECAL

## 2020-10-31 MED ORDER — DIPHENHYDRAMINE HCL 50 MG/ML IJ SOLN: 12.5000 mg | INTRAMUSCULAR | Status: DC | PRN

## 2020-10-31 MED ORDER — MORPHINE SULFATE (PF) 0.5 MG/ML IJ SOLN
INTRAMUSCULAR | Status: AC
  Filled 2020-10-31: qty 10

## 2020-10-31 MED ORDER — SCOPOLAMINE 1 MG/3DAYS TD PT72
1.0000 | MEDICATED_PATCH | Freq: Once | TRANSDERMAL | Status: DC
  Administered 2020-10-31: 1.5 mg via TRANSDERMAL

## 2020-10-31 MED ORDER — IBUPROFEN 800 MG PO TABS
800.0000 mg | ORAL_TABLET | Freq: Four times a day (QID) | ORAL | Status: DC
  Administered 2020-11-01 – 2020-11-02 (×5): 800 mg via ORAL
  Filled 2020-10-31 (×6): qty 1

## 2020-10-31 MED ORDER — CEFAZOLIN SODIUM-DEXTROSE 2-4 GM/100ML-% IV SOLN: 2.0000 g | INTRAVENOUS | Status: DC

## 2020-10-31 MED ORDER — ONDANSETRON HCL 4 MG/2ML IJ SOLN
INTRAMUSCULAR | Status: AC
  Filled 2020-10-31: qty 4

## 2020-10-31 MED ORDER — KETOROLAC TROMETHAMINE 30 MG/ML IJ SOLN
30.0000 mg | Freq: Four times a day (QID) | INTRAMUSCULAR | Status: AC
  Administered 2020-11-01 (×2): 30 mg via INTRAVENOUS
  Filled 2020-10-31 (×2): qty 1

## 2020-10-31 MED ORDER — ONDANSETRON HCL 4 MG/2ML IJ SOLN
INTRAMUSCULAR | Status: DC | PRN
  Administered 2020-10-31 (×2): 4 mg via INTRAVENOUS

## 2020-10-31 MED ORDER — DEXTROSE 5 % IV SOLN
1.0000 ug/kg/h | INTRAVENOUS | Status: DC | PRN
  Filled 2020-10-31: qty 5

## 2020-10-31 MED ORDER — BUPIVACAINE IN DEXTROSE 0.75-8.25 % IT SOLN
INTRATHECAL | Status: DC | PRN
  Administered 2020-10-31: 1.6 mL via INTRATHECAL

## 2020-10-31 MED ORDER — ONDANSETRON HCL 4 MG/2ML IJ SOLN: 4.0000 mg | Freq: Once | INTRAMUSCULAR | Status: DC | PRN

## 2020-10-31 MED ORDER — OXYCODONE HCL 5 MG PO TABS
5.0000 mg | ORAL_TABLET | ORAL | Status: DC | PRN
  Administered 2020-11-01 (×2): 5 mg via ORAL
  Filled 2020-10-31 (×2): qty 1

## 2020-10-31 MED ORDER — OXYTOCIN-SODIUM CHLORIDE 30-0.9 UT/500ML-% IV SOLN: 2.5000 [IU]/h | INTRAVENOUS | Status: AC

## 2020-10-31 MED ORDER — OXYCODONE HCL 5 MG/5ML PO SOLN: 5.0000 mg | Freq: Once | ORAL | Status: DC | PRN

## 2020-10-31 MED ORDER — DIBUCAINE (PERIANAL) 1 % EX OINT: 1.0000 "application " | TOPICAL_OINTMENT | CUTANEOUS | Status: DC | PRN

## 2020-10-31 MED ORDER — LACTATED RINGERS IV SOLN: INTRAVENOUS | Status: DC | PRN

## 2020-10-31 MED ORDER — SCOPOLAMINE 1 MG/3DAYS TD PT72
MEDICATED_PATCH | TRANSDERMAL | Status: AC
  Filled 2020-10-31: qty 1

## 2020-10-31 MED ORDER — KETOROLAC TROMETHAMINE 30 MG/ML IJ SOLN
INTRAMUSCULAR | Status: AC
  Filled 2020-10-31: qty 1

## 2020-10-31 MED ORDER — OXYTOCIN-SODIUM CHLORIDE 30-0.9 UT/500ML-% IV SOLN
INTRAVENOUS | Status: AC
  Filled 2020-10-31: qty 500

## 2020-10-31 MED ORDER — AMISULPRIDE (ANTIEMETIC) 5 MG/2ML IV SOLN: 10.0000 mg | Freq: Once | INTRAVENOUS | Status: DC | PRN

## 2020-10-31 MED ORDER — POVIDONE-IODINE 10 % EX SWAB
2.0000 "application " | Freq: Once | CUTANEOUS | Status: AC
  Administered 2020-10-31: 2 via TOPICAL

## 2020-10-31 MED ORDER — DIPHENHYDRAMINE HCL 25 MG PO CAPS
25.0000 mg | ORAL_CAPSULE | Freq: Four times a day (QID) | ORAL | Status: DC | PRN
  Administered 2020-11-01: 25 mg via ORAL
  Filled 2020-10-31: qty 1

## 2020-10-31 MED ORDER — CEFAZOLIN SODIUM-DEXTROSE 2-4 GM/100ML-% IV SOLN
INTRAVENOUS | Status: AC
  Filled 2020-10-31: qty 100

## 2020-10-31 MED ORDER — SODIUM CHLORIDE 0.9% FLUSH: 3.0000 mL | INTRAVENOUS | Status: DC | PRN

## 2020-10-31 MED ORDER — SENNOSIDES-DOCUSATE SODIUM 8.6-50 MG PO TABS
2.0000 | ORAL_TABLET | ORAL | Status: DC
  Filled 2020-10-31 (×2): qty 2

## 2020-10-31 MED ORDER — OXYCODONE HCL 5 MG PO TABS
5.0000 mg | ORAL_TABLET | Freq: Once | ORAL | Status: DC | PRN
Start: 2020-10-31 — End: 2020-10-31

## 2020-10-31 MED ORDER — PHENYLEPHRINE HCL-NACL 20-0.9 MG/250ML-% IV SOLN
INTRAVENOUS | Status: DC | PRN
  Administered 2020-10-31: 60 ug/min via INTRAVENOUS

## 2020-10-31 MED ORDER — NALOXONE HCL 0.4 MG/ML IJ SOLN: 0.4000 mg | INTRAMUSCULAR | Status: DC | PRN

## 2020-10-31 MED ORDER — MEPERIDINE HCL 25 MG/ML IJ SOLN
6.2500 mg | INTRAMUSCULAR | Status: DC | PRN
Start: 2020-10-31 — End: 2020-10-31

## 2020-10-31 MED ORDER — DEXAMETHASONE SODIUM PHOSPHATE 10 MG/ML IJ SOLN
INTRAMUSCULAR | Status: AC
  Filled 2020-10-31: qty 1

## 2020-10-31 MED ORDER — NALBUPHINE HCL 10 MG/ML IJ SOLN: 5.0000 mg | Freq: Once | INTRAMUSCULAR | Status: DC | PRN

## 2020-10-31 MED ORDER — SIMETHICONE 80 MG PO CHEW: 80.0000 mg | CHEWABLE_TABLET | ORAL | Status: DC | PRN

## 2020-10-31 MED ORDER — NALBUPHINE HCL 10 MG/ML IJ SOLN
5.0000 mg | Freq: Once | INTRAMUSCULAR | Status: DC | PRN
Start: 2020-10-31 — End: 2020-11-02

## 2020-10-31 MED ORDER — KETOROLAC TROMETHAMINE 30 MG/ML IJ SOLN
30.0000 mg | Freq: Four times a day (QID) | INTRAMUSCULAR | Status: AC | PRN
  Administered 2020-10-31: 30 mg via INTRAVENOUS

## 2020-10-31 MED ORDER — ACETAMINOPHEN 500 MG PO TABS
1000.0000 mg | ORAL_TABLET | Freq: Four times a day (QID) | ORAL | Status: DC
  Administered 2020-10-31 – 2020-11-02 (×7): 1000 mg via ORAL
  Filled 2020-10-31 (×9): qty 2

## 2020-10-31 MED ORDER — SODIUM CHLORIDE 0.9 % IV SOLN: INTRAVENOUS | Status: DC | PRN

## 2020-10-31 MED ORDER — PRENATAL MULTIVITAMIN CH
1.0000 | ORAL_TABLET | Freq: Every day | ORAL | Status: DC
  Filled 2020-10-31 (×2): qty 1

## 2020-10-31 MED ORDER — COCONUT OIL OIL: 1.0000 "application " | TOPICAL_OIL | Status: DC | PRN

## 2020-10-31 MED ORDER — CEFAZOLIN SODIUM-DEXTROSE 2-3 GM-%(50ML) IV SOLR
INTRAVENOUS | Status: DC | PRN
  Administered 2020-10-31: 2 g via INTRAVENOUS

## 2020-10-31 MED ORDER — KETOROLAC TROMETHAMINE 30 MG/ML IJ SOLN: 30.0000 mg | Freq: Four times a day (QID) | INTRAMUSCULAR | Status: AC | PRN

## 2020-10-31 SURGICAL SUPPLY — 37 items
BENZOIN TINCTURE PRP APPL 2/3 (GAUZE/BANDAGES/DRESSINGS) ×3 IMPLANT
CLAMP CORD UMBIL (MISCELLANEOUS) IMPLANT
CLOSURE STERI STRIP 1/2 X4 (GAUZE/BANDAGES/DRESSINGS) ×3 IMPLANT
CLOTH BEACON ORANGE TIMEOUT ST (SAFETY) ×3 IMPLANT
DRSG OPSITE POSTOP 4X10 (GAUZE/BANDAGES/DRESSINGS) ×3 IMPLANT
ELECT REM PT RETURN 9FT ADLT (ELECTROSURGICAL) ×3
ELECTRODE REM PT RTRN 9FT ADLT (ELECTROSURGICAL) ×2 IMPLANT
EXTENDER TRAXI PANNICULUS (MISCELLANEOUS) ×2 IMPLANT
EXTRACTOR VACUUM KIWI (MISCELLANEOUS) ×3 IMPLANT
GLOVE BIOGEL PI IND STRL 6.5 (GLOVE) ×2 IMPLANT
GLOVE BIOGEL PI IND STRL 7.0 (GLOVE) ×2 IMPLANT
GLOVE BIOGEL PI INDICATOR 6.5 (GLOVE) ×1
GLOVE BIOGEL PI INDICATOR 7.0 (GLOVE) ×1
GLOVE ECLIPSE 6.0 STRL STRAW (GLOVE) ×3 IMPLANT
GOWN STRL REUS W/TWL LRG LVL3 (GOWN DISPOSABLE) ×6 IMPLANT
KIT ABG SYR 3ML LUER SLIP (SYRINGE) IMPLANT
NEEDLE HYPO 25X5/8 SAFETYGLIDE (NEEDLE) IMPLANT
NS IRRIG 1000ML POUR BTL (IV SOLUTION) ×3 IMPLANT
PACK C SECTION WH (CUSTOM PROCEDURE TRAY) ×3 IMPLANT
PAD OB MATERNITY 4.3X12.25 (PERSONAL CARE ITEMS) ×3 IMPLANT
PENCIL SMOKE EVAC W/HOLSTER (ELECTROSURGICAL) ×3 IMPLANT
RTRCTR C-SECT PINK 25CM LRG (MISCELLANEOUS) ×3 IMPLANT
STRIP CLOSURE SKIN 1/2X4 (GAUZE/BANDAGES/DRESSINGS) ×3 IMPLANT
SUT MNCRL 0 VIOLET CTX 36 (SUTURE) ×6 IMPLANT
SUT MNCRL AB 3-0 PS2 27 (SUTURE) ×3 IMPLANT
SUT MONOCRYL 0 CTX 36 (SUTURE) ×9
SUT PLAIN 0 NONE (SUTURE) IMPLANT
SUT PLAIN 2 0 (SUTURE) ×3
SUT PLAIN ABS 2-0 CT1 27XMFL (SUTURE) ×2 IMPLANT
SUT VIC AB 0 CTX 36 (SUTURE) ×6
SUT VIC AB 0 CTX36XBRD ANBCTRL (SUTURE) ×4 IMPLANT
SUT VIC AB 2-0 CT1 27 (SUTURE) ×3
SUT VIC AB 2-0 CT1 TAPERPNT 27 (SUTURE) ×2 IMPLANT
TOWEL OR 17X24 6PK STRL BLUE (TOWEL DISPOSABLE) ×3 IMPLANT
TRAXI PANNICULUS EXTENDER (MISCELLANEOUS) ×1
TRAY FOLEY W/BAG SLVR 14FR LF (SET/KITS/TRAYS/PACK) ×3 IMPLANT
WATER STERILE IRR 1000ML POUR (IV SOLUTION) ×3 IMPLANT

## 2020-10-31 NOTE — Op Note (Signed)
Cesarean Section Procedure Note  Pre-operative Diagnosis: 1. Intrauterine pregnancy at [redacted]w[redacted]d  2. History of cesarean section, declines trial of labor  Post-operative Diagnosis: same as above  Surgeon: Marlow Baars, MD  Assistants: Waynard Reeds  Procedure: Repeat low transverse cesarean section   Anesthesia: Spinal anesthesia  Estimated Blood Loss: 431 mL         Drains: Foley catheter         Specimens: placenta to labor and delivery to pathology              Complications:  None; patient tolerated the procedure well.         Disposition: PACU - hemodynamically stable.  Findings:  Normal uterus, tubes and ovaries bilaterally.  Viable female infant, 3065g (6lb 12.2oz) Apgars 8, 9.  No significant adhesive disease noted.   Procedure Details   After spinal  anesthesia was found to adequate, the patient was placed in the dorsal supine position with a leftward tilt, prepped and draped in the usual sterile manner. A Pfannenstiel incision was made and carried down through the subcutaneous tissue to the fascia.  The fascia was incised in the midline and the fascial incision was extended laterally with Mayo scissors. The superior aspect of the fascial incision was grasped with two Kocher clamps, tented up and the rectus muscles dissected off sharply. The rectus was then dissected off with blunt dissection and Mayo scissors inferiorly. The rectus muscles were separated in the midline. The abdominal peritoneum was identified, tented up, entered bluntly, and the incision was extended superiorly and inferiorly with good visualization of the bladder. The Alexis retractor was deployed. The vesicouterine peritoneum was identified, tented up, entered sharply, and the bladder flap was created digitally. A scalpel was then used to make a low transverse incision on the uterus which was extended in the cephalad-caudad direction with blunt dissection. The fluid was clear. The fetal vertex was identified,  elevated out of the pelvis and brought to the hysterotomy.  The head was delivered easily followed by the shoulders and body.  After a 60 second delay per protocol, the cord was clamped and cut and the infant was passed to the waiting neonatologist.  The placenta was then delivered spontaneously, intact and appear normal, the uterus was cleared of all clot and debris   The hysterotomy was repaired with #0 Monocryl in running locked fashion.  A second imbricating layer of #0 Monocryl was placed.  A figure of eight suture was placed at the midportion of the hysterotomy, and excellent hemostasis was noted.  The Alexis retractor was removed from the abdomen. The peritoneum was examined and all vessels noted to be hemostatic. The abdominal cavity was cleared of all clot and debris.  The peritoneum was closed with 2-0 vicryl in a running fashion.  The rectus muscles were then closed with 2-0 Vicryl. The fascia and rectus muscles were inspected and were hemostatic. The fascia was closed with 0 Vicryl in a running fashion. The subcutaneous layer was irrigated and all bleeders cauterized. The subcutaneous layer was closed with interrupted plain gut. The skin was closed with 3-0 monocryl in a subcuticular fashion. The incision was dressed with benzoine, steri strips and honeycomb dressing. All sponge lap and needle counts were correct x3. Patient tolerated the procedure well and recovered in stable condition following the procedure.

## 2020-10-31 NOTE — Anesthesia Preprocedure Evaluation (Signed)
Anesthesia Evaluation  Patient identified by MRN, date of birth, ID band Patient awake    Reviewed: Allergy & Precautions, H&P , NPO status , Patient's Chart, lab work & pertinent test results  History of Anesthesia Complications Negative for: history of anesthetic complications  Airway Mallampati: II  TM Distance: >3 FB Neck ROM: full    Dental no notable dental hx.    Pulmonary neg pulmonary ROS,    Pulmonary exam normal        Cardiovascular negative cardio ROS Normal cardiovascular exam     Neuro/Psych negative neurological ROS  negative psych ROS   GI/Hepatic negative GI ROS, Neg liver ROS,   Endo/Other  negative endocrine ROS  Renal/GU negative Renal ROS  negative genitourinary   Musculoskeletal   Abdominal   Peds  Hematology  (+) Blood dyscrasia (Hgb 9.2), anemia ,   Anesthesia Other Findings   Reproductive/Obstetrics (+) Pregnancy                             Anesthesia Physical Anesthesia Plan  ASA: II  Anesthesia Plan: Spinal   Post-op Pain Management:    Induction:   PONV Risk Score and Plan: Ondansetron and Treatment may vary due to age or medical condition  Airway Management Planned:   Additional Equipment:   Intra-op Plan:   Post-operative Plan:   Informed Consent: I have reviewed the patients History and Physical, chart, labs and discussed the procedure including the risks, benefits and alternatives for the proposed anesthesia with the patient or authorized representative who has indicated his/her understanding and acceptance.       Plan Discussed with:   Anesthesia Plan Comments:         Anesthesia Quick Evaluation

## 2020-10-31 NOTE — Anesthesia Procedure Notes (Signed)
Spinal  Patient location during procedure: OR Reason for block: surgical anesthesia Staffing Performed: anesthesiologist  Anesthesiologist: Zelie Asbill E, MD Preanesthetic Checklist Completed: patient identified, IV checked, risks and benefits discussed, surgical consent, monitors and equipment checked, pre-op evaluation and timeout performed Spinal Block Patient position: sitting Prep: DuraPrep and site prepped and draped Patient monitoring: continuous pulse ox, blood pressure and heart rate Approach: midline Location: L3-4 Injection technique: single-shot Needle Needle type: Pencan  Needle gauge: 24 G Needle length: 9 cm Assessment Events: CSF return Additional Notes Functioning IV was confirmed and monitors were applied. Sterile prep and drape, including hand hygiene and sterile gloves were used. The patient was positioned and the spine was prepped. The skin was anesthetized with lidocaine.  Free flow of clear CSF was obtained prior to injecting local anesthetic into the CSF. The needle was carefully withdrawn. The patient tolerated the procedure well.     

## 2020-10-31 NOTE — Transfer of Care (Signed)
Immediate Anesthesia Transfer of Care Note  Patient: Alison Cruz  Procedure(s) Performed: CESAREAN SECTION (N/A )  Patient Location: PACU  Anesthesia Type:Spinal  Level of Consciousness: awake, alert  and oriented  Airway & Oxygen Therapy: Patient Spontanous Breathing  Post-op Assessment: Report given to RN and Post -op Vital signs reviewed and stable  Post vital signs: Reviewed and stable  Last Vitals:  Vitals Value Taken Time  BP 153/119 10/31/20 1324  Temp    Pulse 92 10/31/20 1325  Resp 21 10/31/20 1325  SpO2 100 % 10/31/20 1325  Vitals shown include unvalidated device data.  Last Pain:  Vitals:   10/31/20 0947  TempSrc: Oral  PainSc: 0-No pain         Complications: No complications documented.

## 2020-10-31 NOTE — H&P (Signed)
30 y.o. G2P1001 @ 40w0dpresents for repeat cesarean section.  She was considering a bilateral tubal ligation, but changed her mind today.    Otherwise has good fetal movement and no bleeding.  Pregnancy complicated by: 1. Rubella non-immune.   Plan MMR prior to discharge 2. History of cesarean section.  Reports fetal intolerance of labor.  Declines TOLAC 3. Elevated hemoglobin A1c with prenatal labs.  Failed 1hr, but passed early 3 hr gtt and 3hr gtt at 28 weeks Past Medical History:  Diagnosis Date  . Medical history non-contributory     Past Surgical History:  Procedure Laterality Date  . CESAREAN SECTION    . HERNIA REPAIR      OB History  Gravida Para Term Preterm AB Living  2 1 1     1   SAB IAB Ectopic Multiple Live Births               # Outcome Date GA Lbr Len/2nd Weight Sex Delivery Anes PTL Lv  2 Current           1 Term 03/25/17     CS-LTranv       Social History   Socioeconomic History  . Marital status: Single    Spouse name: Not on file  . Number of children: Not on file  . Years of education: Not on file  . Highest education level: Not on file  Occupational History  . Not on file  Tobacco Use  . Smoking status: Never Smoker  . Smokeless tobacco: Never Used  Vaping Use  . Vaping Use: Never used  Substance and Sexual Activity  . Alcohol use: Not Currently  . Drug use: No  . Sexual activity: Not on file  Other Topics Concern  . Not on file  Social History Narrative  . Not on file   Social Determinants of Health   Financial Resource Strain: Not on file  Food Insecurity: Not on file  Transportation Needs: Not on file  Physical Activity: Not on file  Stress: Not on file  Social Connections: Not on file  Intimate Partner Violence: Not on file   Latex    Prenatal Transfer Tool  Maternal Diabetes: No Genetic Screening: Declined Maternal Ultrasounds/Referrals: Normal Fetal Ultrasounds or other Referrals:  None Maternal Substance Abuse:   No Significant Maternal Medications:  None Significant Maternal Lab Results: Group B Strep negative  ABO, Rh: --/--/A POS (03/15 1011) Antibody: NEG (03/15 1011) Rubella: Equivocal (08/19 0000) RPR: NON REACTIVE (03/15 1035)  HBsAg: Negative (08/19 0000)  HIV: Non-reactive (08/19 0000)  GBS:   Negative     Vitals:   10/31/20 0947  BP: 121/78  Resp: 18  Temp: 98.3 F (36.8 C)     General:  NAD Abdomen:  soft, gravid Ex:  trace edema bilaterally FHTs:  130s    A/P   30y.o. G2P1001 314w0dresents for repeat cesarean section.  Discussed risks of cesarean section to include, but not limited to, infection, bleeding, damage to surrounding strutcures (including bowel, bladder, tubes, ovaries, nerves, vessels, baby), risk of blood clot, need for transfusion, need for additional procedures, risk of regret. Consent signed.  No longer desires a tubal ligation.    Preop hemoglobin 9.2.  On last check, hemoglobin > 12, so patient has not had a work up or been on iron.  Likely iron deficiency anemia base on low MCV.  Will plan PO iron after discharge.   Ancef 2gm on call to [facility]  GEFFEL [employer]

## 2020-10-31 NOTE — Anesthesia Postprocedure Evaluation (Signed)
Anesthesia Post Note  Patient: Alison Cruz  Procedure(s) Performed: CESAREAN SECTION (N/A )     Patient location during evaluation: PACU Anesthesia Type: Spinal Level of consciousness: oriented and awake and alert Pain management: pain level controlled Vital Signs Assessment: post-procedure vital signs reviewed and stable Respiratory status: spontaneous breathing, respiratory function stable and nonlabored ventilation Cardiovascular status: blood pressure returned to baseline and stable Postop Assessment: no headache, no backache, no apparent nausea or vomiting and spinal receding Anesthetic complications: no   No complications documented.  Last Vitals:  Vitals:   10/31/20 1415 10/31/20 1428  BP: 114/80 111/69  Pulse: 73 76  Resp: 17 18  Temp: 36.6 C 36.8 C  SpO2: 100% 100%    Last Pain:  Vitals:   10/31/20 1440  TempSrc:   PainSc: 0-No pain   Pain Goal:    LLE Motor Response: Purposeful movement (10/31/20 1415)   RLE Motor Response: Purposeful movement (10/31/20 1415)       Epidural/Spinal Function Cutaneous sensation: Able to Wiggle Toes (10/31/20 1440), Patient able to flex knees: Yes (10/31/20 1440), Patient able to lift hips off bed: No (10/31/20 1440), Back pain beyond tenderness at insertion site: No (10/31/20 1440), Progressively worsening motor and/or sensory loss: No (10/31/20 1440), Bowel and/or bladder incontinence post epidural: No (10/31/20 1440)  Lucretia Kern

## 2020-11-01 LAB — CBC
HCT: 26 % — ABNORMAL LOW (ref 36.0–46.0)
Hemoglobin: 7.7 g/dL — ABNORMAL LOW (ref 12.0–15.0)
MCH: 22.2 pg — ABNORMAL LOW (ref 26.0–34.0)
MCHC: 29.6 g/dL — ABNORMAL LOW (ref 30.0–36.0)
MCV: 74.9 fL — ABNORMAL LOW (ref 80.0–100.0)
Platelets: 231 10*3/uL (ref 150–400)
RBC: 3.47 MIL/uL — ABNORMAL LOW (ref 3.87–5.11)
RDW: 17.6 % — ABNORMAL HIGH (ref 11.5–15.5)
WBC: 14.2 10*3/uL — ABNORMAL HIGH (ref 4.0–10.5)
nRBC: 0.4 % — ABNORMAL HIGH (ref 0.0–0.2)

## 2020-11-01 MED ORDER — FERROUS GLUCONATE 324 (38 FE) MG PO TABS
324.0000 mg | ORAL_TABLET | Freq: Three times a day (TID) | ORAL | Status: DC
  Administered 2020-11-01 – 2020-11-02 (×4): 324 mg via ORAL
  Filled 2020-11-01 (×6): qty 1

## 2020-11-01 NOTE — Progress Notes (Signed)
Patient is eating, ambulating, voiding.  Pain control is good.  Appropriate lochia, no complaints. +flatus  Vitals:   10/31/20 1747 10/31/20 2145 11/01/20 0200 11/01/20 0545  BP:  (!) 106/59 (!) 110/59 107/63  Pulse:  69 66 (!) 54  Resp: 18 18 18 17   Temp: 98.2 F (36.8 C) 98.3 F (36.8 C) 98.4 F (36.9 C) 98.2 F (36.8 C)  TempSrc: Oral Oral Oral Oral  SpO2: 100% 97%  100%    [employer]: c/d/i Ext: no calf tenderness  Lab Results  Component Value Date   WBC 14.2 (H) 11/01/2020   HGB 7.7 (L) 11/01/2020   HCT 26.0 (L) 11/01/2020   MCV 74.9 (L) 11/01/2020   PLT 231 11/01/2020    --/--/A POS (03/15 1011)  A/P Post op day #1 s/p R C/S Acute blood loss post surgical anemia: Hb 7.7 Iron tid  Routine care.  Expect d/c 3/19.    4/19

## 2020-11-02 MED ORDER — OXYCODONE HCL 5 MG PO TABS: 5.0000 mg | ORAL_TABLET | ORAL | 0 refills | Status: AC | PRN

## 2020-11-02 NOTE — Progress Notes (Signed)
AVS printed and discharged instructions given to patient. Patient instructed to call for follow-up appointment and to pick up prescriptions. All questions answered and patient verbalized understanding.  

## 2020-11-02 NOTE — Discharge Summary (Signed)
Postpartum Discharge Summary  Patient Name: Alison Cruz DOB: March 22, 1991 MRN: [id number]  Date of admission: 10/31/2020 Delivery date:10/31/2020  Delivering provider: Jerelyn Charles  Date of discharge: 11/02/2020  Admitting diagnosis: History of cesarean delivery [Z98.891] Intrauterine pregnancy: [redacted]w[redacted]d    Secondary diagnosis:  Active Problems:   History of cesarean delivery  Additional problems: none    Discharge diagnosis: Term Pregnancy Delivered                                              Post partum procedures:none Augmentation: N/A Complications: None  Hospital course: Sceduled C/S   30y.o. yo G2P2002 at [id number] admitted to the hospital 10/31/2020 for scheduled cesarean section with the following indication:Elective Repeat.Delivery details are as follows:  Membrane Rupture Time/Date: 12:35 PM ,10/31/2020   Delivery Method:C-Section, Vacuum Assisted  Details of operation can be found in separate operative note.  Patient had an uncomplicated postpartum course.  She is ambulating, tolerating a regular diet, passing flatus, and urinating well. Patient is discharged home in stable condition on  11/02/20        Newborn Data: Birth date:10/31/2020  Birth time:12:37 PM  Gender:Female  Living status:Living  Apgars:8 ,9  Weight:3065 g     Magnesium Sulfate received: No BMZ received: No Rhophylac:N/A MMR:N/A T-DaP:see office note Flu: N/A Transfusion:No  Physical exam  Vitals:   11/01/20 0545 11/01/20 1400 11/01/20 2116 11/02/20 0530  BP: 107/63 103/65 120/73 126/79  Pulse: (!) 54 60 63 70  Resp: _0 Temp: 98.2 F (36.8 C) 98.8 F (37.1 C) 98.5 F (36.9 C) 97.6 F (36.4 C)  TempSrc: Oral Oral Oral Oral  SpO2: 100% 99% 100% 96%   General: alert, cooperative and no distress Lochia: appropriate Uterine Fundus: firm Incision: Healing well with no significant drainage, No significant erythema, Dressing is clean, dry, and intact DVT Evaluation: No  evidence of DVT seen on physical exam. Labs: Lab Results  Component Value Date   WBC 14.2 (H) 11/01/2020   HGB 7.7 (L) 11/01/2020   HCT 26.0 (L) 11/01/2020   MCV 74.9 (L) 11/01/2020   PLT 231 11/01/2020   CMP Latest Ref Rng & Units 08/06/2020  Glucose 70 - 99 mg/dL 93  BUN 6 - 20 mg/dL 5(L)  Creatinine 0.44 - 1.00 mg/dL 0.44  Sodium 135 - 145 mmol/L 131(L)  Potassium 3.5 - 5.1 mmol/L 3.2(L)  Chloride 98 - 111 mmol/L 101  CO2 22 - 32 mmol/L 21(L)  Calcium 8.9 - 10.3 mg/dL 8.9  Total Protein 6.5 - 8.1 g/dL 7.0  Total Bilirubin 0.3 - 1.2 mg/dL <0.1(L)  Alkaline Phos 38 - 126 U/L 66  AST 15 - 41 U/L 19  ALT 0 - 44 U/L 10   Edinburgh Score: Edinburgh Postnatal Depression Scale Screening Tool 11/01/2020  I have been able to laugh and see the funny side of things. 0  I have looked forward with enjoyment to things. 0  I have blamed myself unnecessarily when things went wrong. 0  I have been anxious or worried for no good reason. 0  I have felt scared or panicky for no good reason. 0  Things have been getting on top of me. 0  I have been so unhappy that I have had difficulty sleeping. 0  I have felt sad or miserable. 0  I have been so unhappy that I have been crying. 0  The thought of harming myself has occurred to me. 0  Edinburgh Postnatal Depression Scale Total 0      After visit meds:  Allergies as of 11/02/2020      Reactions   Latex    Irritation       Medication List    TAKE these medications   multivitamin tablet Take 1 tablet by mouth daily.   oxyCODONE 5 MG immediate release tablet Commonly known as: Oxy IR/ROXICODONE Take 1-2 tablets (5-10 mg total) by mouth every 4 (four) hours as needed for moderate pain.            Discharge Care Instructions  (From admission, onward)         Start     Ordered   11/02/20 0000  Discharge wound care:       Comments: Can remove dressing and steri strips 7 days from date of surgery   11/02/20 1024            Discharge home in stable condition Infant Feeding: see peds note Infant Disposition:home with mother Discharge instruction: per After Visit Summary and Postpartum booklet. Activity: Advance as tolerated. Pelvic rest for 6 weeks.  Diet: routine diet Anticipated Birth Control: Unsure Postpartum Appointment:4 weeks Additional Postpartum F/U: Postpartum Depression checkup Future Appointments:No future appointments. Follow up Visit:  Follow-up Information    Jerelyn Charles, MD Follow up in 4 week(s).   Specialty: Obstetrics Contact information: [address] [place] [address] [phone]                   11/02/2020 Allyn Kenner, DO

## 2022-11-15 IMAGING — DX DG CHEST 1V PORT
1 series · 1 of 1 positions shown · non-contrast
Comparison: 08/12/2016

CLINICAL DATA: Cough

EXAM:
PORTABLE CHEST 1 VIEW

[chest ap]
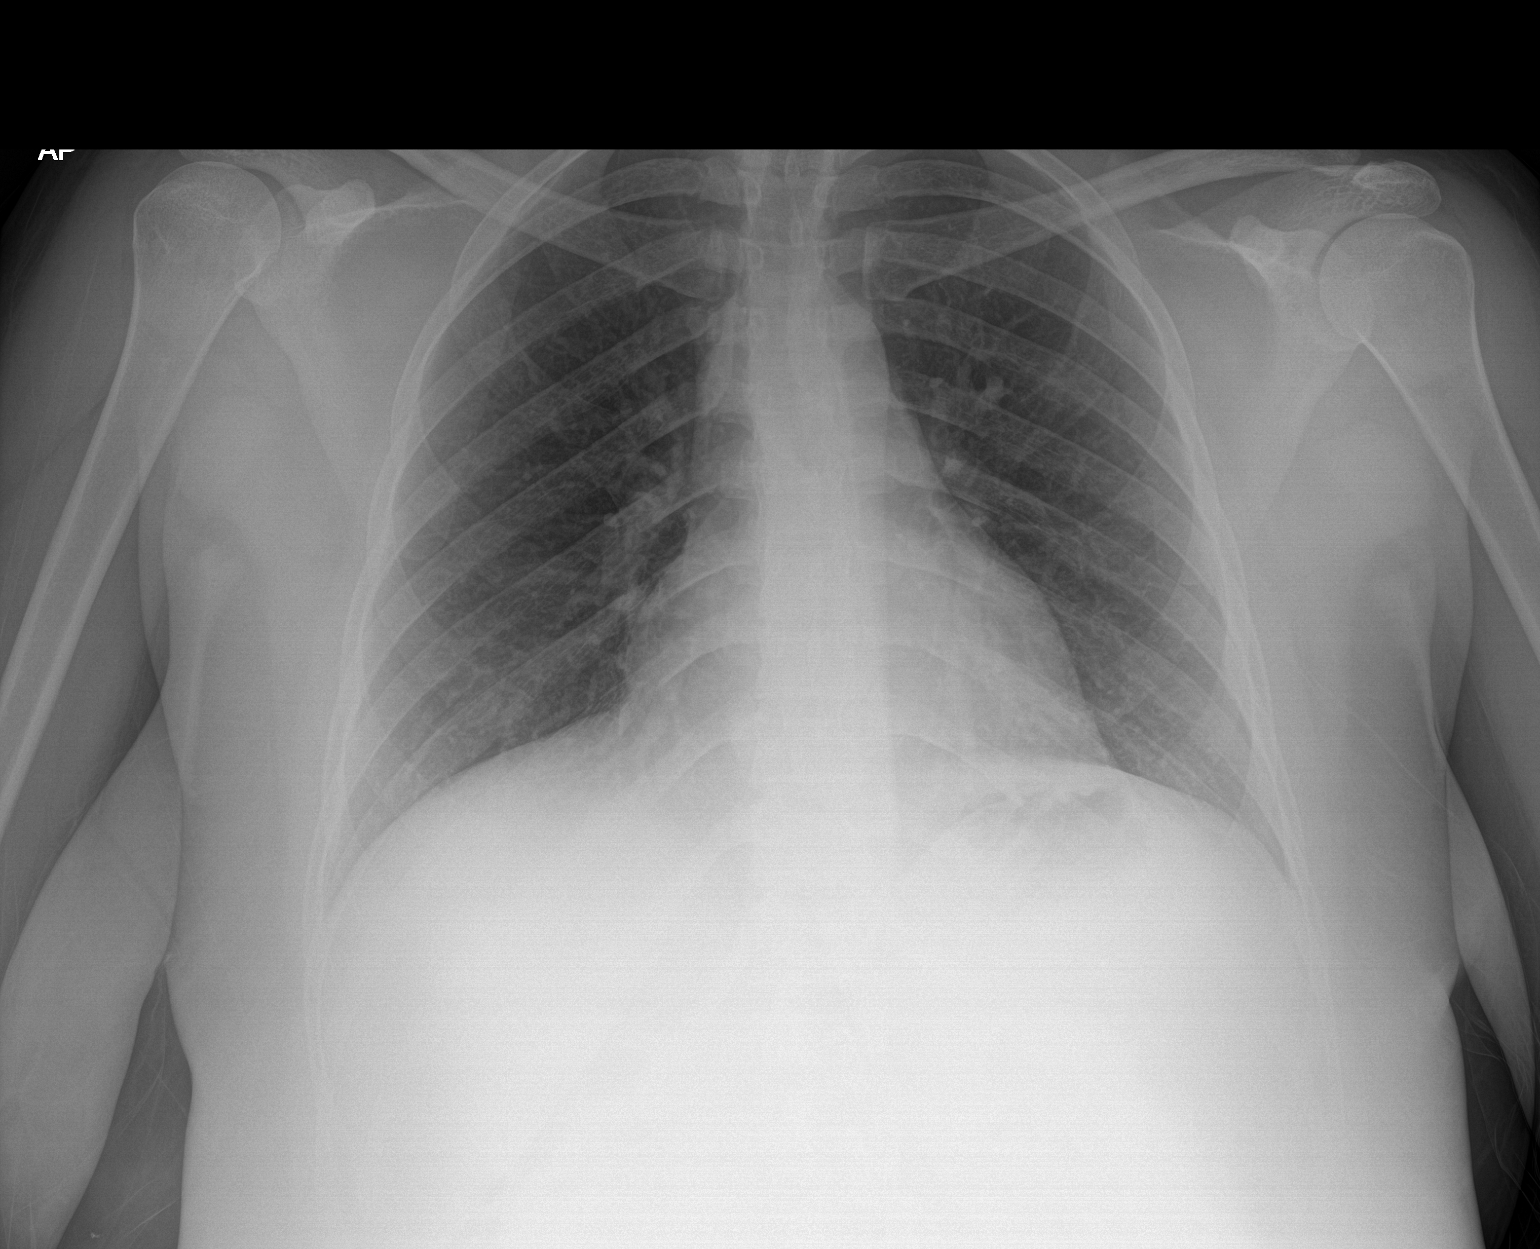

[1 of 1 positions shown; findings below may reference images not displayed]

FINDINGS: The heart size and mediastinal contours are within normal limits. No
focal airspace consolidation, pleural effusion, or pneumothorax. The
visualized skeletal structures are unremarkable.
IMPRESSION: No active disease.
